# Patient Record
Sex: Female | Born: 1950 | Race: White | Hispanic: No | Marital: Married | Smoking: Never smoker
Health system: Southern US, Community
[De-identification: ages and names within clinical notes are randomized; demographics above are authoritative.]

## PROBLEM LIST (undated history)

## (undated) DIAGNOSIS — M1990 Unspecified osteoarthritis, unspecified site: Secondary | ICD-10-CM

## (undated) DIAGNOSIS — I10 Essential (primary) hypertension: Secondary | ICD-10-CM

## (undated) HISTORY — PX: OTHER SURGICAL HISTORY: SHX169

## (undated) HISTORY — DX: Unspecified osteoarthritis, unspecified site: M19.90

## (undated) HISTORY — DX: Essential (primary) hypertension: I10

## (undated) HISTORY — PX: DILATION AND CURETTAGE OF UTERUS: SHX78

---
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
## 1999-02-11 ENCOUNTER — Ambulatory Visit (HOSPITAL_COMMUNITY): Admission: RE | Admit: 1999-02-11 | Discharge: 1999-02-11 | Payer: Self-pay | Admitting: Family Medicine

## 1999-02-11 ENCOUNTER — Encounter: Payer: Self-pay | Admitting: Family Medicine

## 1999-12-09 ENCOUNTER — Encounter: Payer: Self-pay | Admitting: Obstetrics and Gynecology

## 1999-12-09 ENCOUNTER — Encounter: Admission: RE | Admit: 1999-12-09 | Discharge: 1999-12-09 | Payer: Self-pay | Admitting: Obstetrics and Gynecology

## 2001-01-23 ENCOUNTER — Encounter: Admission: RE | Admit: 2001-01-23 | Discharge: 2001-01-23 | Payer: Self-pay | Admitting: Obstetrics and Gynecology

## 2001-01-23 ENCOUNTER — Encounter: Payer: Self-pay | Admitting: Obstetrics and Gynecology

## 2001-01-30 ENCOUNTER — Encounter: Admission: RE | Admit: 2001-01-30 | Discharge: 2001-01-30 | Payer: Self-pay | Admitting: Obstetrics and Gynecology

## 2001-01-30 ENCOUNTER — Encounter: Payer: Self-pay | Admitting: Obstetrics and Gynecology

## 2002-01-02 ENCOUNTER — Encounter (INDEPENDENT_AMBULATORY_CARE_PROVIDER_SITE_OTHER): Payer: Self-pay | Admitting: *Deleted

## 2002-01-02 ENCOUNTER — Encounter: Payer: Self-pay | Admitting: Obstetrics and Gynecology

## 2002-01-02 ENCOUNTER — Encounter: Admission: RE | Admit: 2002-01-02 | Discharge: 2002-01-02 | Payer: Self-pay | Admitting: Obstetrics and Gynecology

## 2002-01-31 ENCOUNTER — Encounter: Admission: RE | Admit: 2002-01-31 | Discharge: 2002-01-31 | Payer: Self-pay | Admitting: Obstetrics and Gynecology

## 2002-01-31 ENCOUNTER — Encounter: Payer: Self-pay | Admitting: Obstetrics and Gynecology

## 2002-02-12 ENCOUNTER — Encounter: Payer: Self-pay | Admitting: Obstetrics and Gynecology

## 2002-02-12 ENCOUNTER — Ambulatory Visit (HOSPITAL_COMMUNITY): Admission: RE | Admit: 2002-02-12 | Discharge: 2002-02-12 | Payer: Self-pay | Admitting: Obstetrics and Gynecology

## 2002-04-17 ENCOUNTER — Encounter: Payer: Self-pay | Admitting: Obstetrics and Gynecology

## 2002-04-17 ENCOUNTER — Encounter: Admission: RE | Admit: 2002-04-17 | Discharge: 2002-04-17 | Payer: Self-pay | Admitting: Obstetrics and Gynecology

## 2002-08-18 ENCOUNTER — Encounter: Admission: RE | Admit: 2002-08-18 | Discharge: 2002-08-18 | Payer: Self-pay | Admitting: Obstetrics and Gynecology

## 2002-08-18 ENCOUNTER — Encounter: Payer: Self-pay | Admitting: Obstetrics and Gynecology

## 2003-02-12 ENCOUNTER — Encounter: Admission: RE | Admit: 2003-02-12 | Discharge: 2003-02-12 | Payer: Self-pay | Admitting: Obstetrics and Gynecology

## 2003-03-16 ENCOUNTER — Encounter: Admission: RE | Admit: 2003-03-16 | Discharge: 2003-03-16 | Payer: Self-pay | Admitting: Obstetrics and Gynecology

## 2003-09-07 ENCOUNTER — Ambulatory Visit (HOSPITAL_COMMUNITY): Admission: RE | Admit: 2003-09-07 | Discharge: 2003-09-07 | Payer: Self-pay | Admitting: Family Medicine

## 2004-04-07 ENCOUNTER — Encounter: Admission: RE | Admit: 2004-04-07 | Discharge: 2004-04-07 | Payer: Self-pay | Admitting: Obstetrics and Gynecology

## 2004-11-03 ENCOUNTER — Encounter: Admission: RE | Admit: 2004-11-03 | Discharge: 2004-11-03 | Payer: Self-pay | Admitting: Obstetrics and Gynecology

## 2005-01-11 ENCOUNTER — Ambulatory Visit: Payer: Self-pay | Admitting: Internal Medicine

## 2005-01-31 ENCOUNTER — Encounter (INDEPENDENT_AMBULATORY_CARE_PROVIDER_SITE_OTHER): Payer: Self-pay | Admitting: Specialist

## 2005-01-31 ENCOUNTER — Ambulatory Visit: Payer: Self-pay | Admitting: Internal Medicine

## 2005-02-10 ENCOUNTER — Encounter: Admission: RE | Admit: 2005-02-10 | Discharge: 2005-02-10 | Payer: Self-pay | Admitting: Obstetrics and Gynecology

## 2005-06-21 ENCOUNTER — Encounter: Admission: RE | Admit: 2005-06-21 | Discharge: 2005-06-21 | Payer: Self-pay | Admitting: Obstetrics and Gynecology

## 2005-08-14 ENCOUNTER — Encounter: Admission: RE | Admit: 2005-08-14 | Discharge: 2005-08-14 | Payer: Self-pay | Admitting: Obstetrics and Gynecology

## 2005-08-22 ENCOUNTER — Encounter: Admission: RE | Admit: 2005-08-22 | Discharge: 2005-08-22 | Payer: Self-pay | Admitting: Obstetrics and Gynecology

## 2006-10-17 ENCOUNTER — Encounter: Admission: RE | Admit: 2006-10-17 | Discharge: 2006-10-17 | Payer: Self-pay | Admitting: Obstetrics and Gynecology

## 2007-08-26 ENCOUNTER — Encounter: Admission: RE | Admit: 2007-08-26 | Discharge: 2007-08-26 | Payer: Self-pay | Admitting: Family Medicine

## 2007-11-13 ENCOUNTER — Encounter: Admission: RE | Admit: 2007-11-13 | Discharge: 2007-11-13 | Payer: Self-pay | Admitting: Family Medicine

## 2007-11-13 ENCOUNTER — Encounter: Admission: RE | Admit: 2007-11-13 | Discharge: 2007-11-13 | Payer: Self-pay | Admitting: Obstetrics and Gynecology

## 2007-11-28 ENCOUNTER — Encounter: Admission: RE | Admit: 2007-11-28 | Discharge: 2007-11-28 | Payer: Self-pay | Admitting: Obstetrics and Gynecology

## 2009-04-08 ENCOUNTER — Encounter: Admission: RE | Admit: 2009-04-08 | Discharge: 2009-04-08 | Payer: Self-pay | Admitting: Obstetrics and Gynecology

## 2009-12-23 ENCOUNTER — Encounter: Payer: Self-pay | Admitting: Internal Medicine

## 2010-04-03 ENCOUNTER — Encounter: Payer: Self-pay | Admitting: Obstetrics and Gynecology

## 2010-04-14 NOTE — Letter (Signed)
Summary: Colonoscopy Letter  [facility]  [address] [place], [place] [address]   Phone: [phone]  Fax: [phone]      December 23, 2009 MRN: [id number]   [facility] [address] [place], [place]  [address]   Dear [name],   According to your medical record, it is time for you to schedule a Colonoscopy. The American Cancer Society recommends this procedure as a method to detect early colon cancer. Patients with a family history of colon cancer, or a personal history of colon polyps or inflammatory bowel disease are at increased risk.  This letter has been generated based on the recommendations made at the time of your procedure. If you feel that in your particular situation this may no longer apply, please contact our office.  Please call our office at [phone] to schedule this appointment or to update your records at your earliest convenience.  Thank you for cooperating with [place] to provide you with the very best care possible.   Sincerely,  Wilhemina Bonito. Marina Goodell, M.D.  [facility] [phone]

## 2010-05-03 ENCOUNTER — Other Ambulatory Visit: Payer: Self-pay | Admitting: Family Medicine

## 2010-05-03 DIAGNOSIS — Z1231 Encounter for screening mammogram for malignant neoplasm of breast: Secondary | ICD-10-CM

## 2010-05-11 ENCOUNTER — Ambulatory Visit: Payer: Self-pay

## 2010-05-18 ENCOUNTER — Ambulatory Visit
Admission: RE | Admit: 2010-05-18 | Discharge: 2010-05-18 | Disposition: A | Discharge status: Home / Self Care | Payer: BC Managed Care – PPO | Source: Ambulatory Visit | Attending: Family Medicine | Admitting: Family Medicine

## 2010-05-18 DIAGNOSIS — Z1231 Encounter for screening mammogram for malignant neoplasm of breast: Secondary | ICD-10-CM

## 2011-10-13 ENCOUNTER — Other Ambulatory Visit: Payer: Self-pay | Admitting: Family Medicine

## 2011-10-13 DIAGNOSIS — Z1231 Encounter for screening mammogram for malignant neoplasm of breast: Secondary | ICD-10-CM

## 2011-10-25 ENCOUNTER — Ambulatory Visit
Admission: RE | Admit: 2011-10-25 | Discharge: 2011-10-25 | Disposition: A | Discharge status: Home / Self Care | Payer: BC Managed Care – PPO | Source: Ambulatory Visit | Attending: Family Medicine | Admitting: Family Medicine

## 2011-10-25 DIAGNOSIS — Z1231 Encounter for screening mammogram for malignant neoplasm of breast: Secondary | ICD-10-CM

## 2011-12-01 ENCOUNTER — Encounter: Payer: Self-pay | Admitting: Internal Medicine

## 2011-12-04 ENCOUNTER — Encounter: Payer: Self-pay | Admitting: Internal Medicine

## 2012-06-24 ENCOUNTER — Other Ambulatory Visit: Payer: Self-pay | Admitting: Family Medicine

## 2012-06-24 DIAGNOSIS — Z780 Asymptomatic menopausal state: Secondary | ICD-10-CM

## 2012-07-09 ENCOUNTER — Ambulatory Visit
Admission: RE | Admit: 2012-07-09 | Discharge: 2012-07-09 | Disposition: A | Discharge status: Home / Self Care | Payer: BC Managed Care – PPO | Source: Ambulatory Visit | Attending: Family Medicine | Admitting: Family Medicine

## 2012-07-09 DIAGNOSIS — Z780 Asymptomatic menopausal state: Secondary | ICD-10-CM

## 2012-07-15 ENCOUNTER — Other Ambulatory Visit: Payer: Self-pay | Admitting: *Deleted

## 2012-07-15 DIAGNOSIS — Z8742 Personal history of other diseases of the female genital tract: Secondary | ICD-10-CM

## 2012-07-16 ENCOUNTER — Ambulatory Visit
Admission: RE | Admit: 2012-07-16 | Discharge: 2012-07-16 | Disposition: A | Discharge status: Home / Self Care | Payer: BC Managed Care – PPO | Source: Ambulatory Visit | Attending: *Deleted | Admitting: *Deleted

## 2012-07-16 DIAGNOSIS — Z8742 Personal history of other diseases of the female genital tract: Secondary | ICD-10-CM

## 2013-06-05 ENCOUNTER — Other Ambulatory Visit: Payer: Self-pay | Admitting: Family Medicine

## 2013-06-05 DIAGNOSIS — N644 Mastodynia: Secondary | ICD-10-CM

## 2013-06-17 ENCOUNTER — Ambulatory Visit
Admission: RE | Admit: 2013-06-17 | Discharge: 2013-06-17 | Disposition: A | Discharge status: Home / Self Care | Payer: Self-pay | Source: Ambulatory Visit | Attending: Family Medicine | Admitting: Family Medicine

## 2013-06-17 ENCOUNTER — Ambulatory Visit
Admission: RE | Admit: 2013-06-17 | Discharge: 2013-06-17 | Disposition: A | Discharge status: Home / Self Care | Payer: BC Managed Care – PPO | Source: Ambulatory Visit | Attending: Family Medicine | Admitting: Family Medicine

## 2013-06-17 DIAGNOSIS — N644 Mastodynia: Secondary | ICD-10-CM

## 2014-09-09 ENCOUNTER — Other Ambulatory Visit: Payer: Self-pay

## 2014-09-09 DIAGNOSIS — Z1231 Encounter for screening mammogram for malignant neoplasm of breast: Secondary | ICD-10-CM

## 2014-09-22 ENCOUNTER — Ambulatory Visit
Admission: RE | Admit: 2014-09-22 | Discharge: 2014-09-22 | Disposition: A | Discharge status: Home / Self Care | Payer: BC Managed Care – PPO | Source: Ambulatory Visit

## 2014-09-22 DIAGNOSIS — Z1231 Encounter for screening mammogram for malignant neoplasm of breast: Secondary | ICD-10-CM

## 2015-12-02 ENCOUNTER — Other Ambulatory Visit: Payer: Self-pay | Admitting: Family Medicine

## 2015-12-02 DIAGNOSIS — Z1231 Encounter for screening mammogram for malignant neoplasm of breast: Secondary | ICD-10-CM

## 2015-12-08 ENCOUNTER — Ambulatory Visit
Admission: RE | Admit: 2015-12-08 | Discharge: 2015-12-08 | Disposition: A | Discharge status: Home / Self Care | Payer: BC Managed Care – PPO | Source: Ambulatory Visit | Attending: Family Medicine | Admitting: Family Medicine

## 2015-12-08 DIAGNOSIS — Z1231 Encounter for screening mammogram for malignant neoplasm of breast: Secondary | ICD-10-CM

## 2015-12-10 ENCOUNTER — Other Ambulatory Visit: Payer: Self-pay | Admitting: Family Medicine

## 2015-12-10 DIAGNOSIS — R928 Other abnormal and inconclusive findings on diagnostic imaging of breast: Secondary | ICD-10-CM

## 2015-12-15 ENCOUNTER — Ambulatory Visit
Admission: RE | Admit: 2015-12-15 | Discharge: 2015-12-15 | Disposition: A | Discharge status: Home / Self Care | Payer: BC Managed Care – PPO | Source: Ambulatory Visit | Attending: Family Medicine | Admitting: Family Medicine

## 2015-12-15 ENCOUNTER — Other Ambulatory Visit: Payer: Self-pay | Admitting: Family Medicine

## 2015-12-15 DIAGNOSIS — R928 Other abnormal and inconclusive findings on diagnostic imaging of breast: Secondary | ICD-10-CM

## 2016-10-24 ENCOUNTER — Other Ambulatory Visit: Payer: Self-pay | Admitting: Family Medicine

## 2016-10-24 ENCOUNTER — Other Ambulatory Visit (HOSPITAL_COMMUNITY)
Admission: RE | Admit: 2016-10-24 | Discharge: 2016-10-24 | Disposition: A | Discharge status: Home / Self Care | Payer: BC Managed Care – PPO | Source: Ambulatory Visit | Attending: Family Medicine | Admitting: Family Medicine

## 2016-10-24 DIAGNOSIS — Z124 Encounter for screening for malignant neoplasm of cervix: Secondary | ICD-10-CM | POA: Insufficient documentation

## 2016-10-26 LAB — CYTOLOGY - PAP: Diagnosis: NEGATIVE

## 2017-04-04 ENCOUNTER — Other Ambulatory Visit: Payer: Self-pay | Admitting: Family Medicine

## 2017-04-04 ENCOUNTER — Ambulatory Visit: Payer: BC Managed Care – PPO

## 2017-04-04 DIAGNOSIS — Z139 Encounter for screening, unspecified: Secondary | ICD-10-CM

## 2017-04-05 ENCOUNTER — Ambulatory Visit: Payer: BC Managed Care – PPO

## 2017-04-05 ENCOUNTER — Ambulatory Visit
Admission: RE | Admit: 2017-04-05 | Discharge: 2017-04-05 | Disposition: A | Discharge status: Home / Self Care | Payer: BC Managed Care – PPO | Source: Ambulatory Visit | Attending: Family Medicine | Admitting: Family Medicine

## 2017-04-05 DIAGNOSIS — Z139 Encounter for screening, unspecified: Secondary | ICD-10-CM

## 2018-04-25 ENCOUNTER — Other Ambulatory Visit: Payer: Self-pay | Admitting: Family Medicine

## 2018-04-25 DIAGNOSIS — Z1231 Encounter for screening mammogram for malignant neoplasm of breast: Secondary | ICD-10-CM

## 2018-05-22 ENCOUNTER — Ambulatory Visit: Payer: BC Managed Care – PPO

## 2018-11-07 ENCOUNTER — Ambulatory Visit
Admission: RE | Admit: 2018-11-07 | Discharge: 2018-11-07 | Disposition: A | Discharge status: Home / Self Care | Payer: BC Managed Care – PPO | Source: Ambulatory Visit | Attending: Family Medicine | Admitting: Family Medicine

## 2018-11-07 ENCOUNTER — Other Ambulatory Visit: Payer: Self-pay

## 2018-11-07 DIAGNOSIS — Z1231 Encounter for screening mammogram for malignant neoplasm of breast: Secondary | ICD-10-CM

## 2019-12-15 ENCOUNTER — Other Ambulatory Visit: Payer: Self-pay | Admitting: Family Medicine

## 2019-12-15 DIAGNOSIS — Z1231 Encounter for screening mammogram for malignant neoplasm of breast: Secondary | ICD-10-CM

## 2020-01-06 ENCOUNTER — Ambulatory Visit
Admission: RE | Admit: 2020-01-06 | Discharge: 2020-01-06 | Disposition: A | Discharge status: Home / Self Care | Payer: Medicare PPO | Source: Ambulatory Visit | Attending: Family Medicine | Admitting: Family Medicine

## 2020-01-06 ENCOUNTER — Other Ambulatory Visit: Payer: Self-pay

## 2020-01-06 DIAGNOSIS — Z1231 Encounter for screening mammogram for malignant neoplasm of breast: Secondary | ICD-10-CM | POA: Diagnosis not present

## 2020-01-12 DIAGNOSIS — Z23 Encounter for immunization: Secondary | ICD-10-CM | POA: Diagnosis not present

## 2020-05-20 DIAGNOSIS — Z23 Encounter for immunization: Secondary | ICD-10-CM | POA: Diagnosis not present

## 2020-05-20 DIAGNOSIS — I10 Essential (primary) hypertension: Secondary | ICD-10-CM | POA: Diagnosis not present

## 2020-05-20 DIAGNOSIS — E7800 Pure hypercholesterolemia, unspecified: Secondary | ICD-10-CM | POA: Diagnosis not present

## 2020-12-17 DIAGNOSIS — I10 Essential (primary) hypertension: Secondary | ICD-10-CM | POA: Diagnosis not present

## 2020-12-21 DIAGNOSIS — Z1389 Encounter for screening for other disorder: Secondary | ICD-10-CM | POA: Diagnosis not present

## 2020-12-21 DIAGNOSIS — Z23 Encounter for immunization: Secondary | ICD-10-CM | POA: Diagnosis not present

## 2020-12-21 DIAGNOSIS — B009 Herpesviral infection, unspecified: Secondary | ICD-10-CM | POA: Diagnosis not present

## 2020-12-21 DIAGNOSIS — F419 Anxiety disorder, unspecified: Secondary | ICD-10-CM | POA: Diagnosis not present

## 2020-12-21 DIAGNOSIS — I10 Essential (primary) hypertension: Secondary | ICD-10-CM | POA: Diagnosis not present

## 2020-12-21 DIAGNOSIS — M25552 Pain in left hip: Secondary | ICD-10-CM | POA: Diagnosis not present

## 2020-12-21 DIAGNOSIS — Z0000 Encounter for general adult medical examination without abnormal findings: Secondary | ICD-10-CM | POA: Diagnosis not present

## 2020-12-21 DIAGNOSIS — L989 Disorder of the skin and subcutaneous tissue, unspecified: Secondary | ICD-10-CM | POA: Diagnosis not present

## 2020-12-21 DIAGNOSIS — E7800 Pure hypercholesterolemia, unspecified: Secondary | ICD-10-CM | POA: Diagnosis not present

## 2021-06-06 ENCOUNTER — Other Ambulatory Visit: Payer: Self-pay | Admitting: Family Medicine

## 2021-06-06 DIAGNOSIS — Z1231 Encounter for screening mammogram for malignant neoplasm of breast: Secondary | ICD-10-CM

## 2021-06-08 ENCOUNTER — Ambulatory Visit
Admission: RE | Admit: 2021-06-08 | Discharge: 2021-06-08 | Disposition: A | Discharge status: Home / Self Care | Payer: Medicare PPO | Source: Ambulatory Visit | Attending: Family Medicine | Admitting: Family Medicine

## 2021-06-08 DIAGNOSIS — Z1231 Encounter for screening mammogram for malignant neoplasm of breast: Secondary | ICD-10-CM

## 2021-06-21 DIAGNOSIS — E7800 Pure hypercholesterolemia, unspecified: Secondary | ICD-10-CM | POA: Diagnosis not present

## 2021-06-21 DIAGNOSIS — I10 Essential (primary) hypertension: Secondary | ICD-10-CM | POA: Diagnosis not present

## 2021-06-21 DIAGNOSIS — B009 Herpesviral infection, unspecified: Secondary | ICD-10-CM | POA: Diagnosis not present

## 2021-06-21 DIAGNOSIS — M2550 Pain in unspecified joint: Secondary | ICD-10-CM | POA: Diagnosis not present

## 2021-06-21 DIAGNOSIS — F419 Anxiety disorder, unspecified: Secondary | ICD-10-CM | POA: Diagnosis not present

## 2021-12-20 DIAGNOSIS — Z136 Encounter for screening for cardiovascular disorders: Secondary | ICD-10-CM | POA: Diagnosis not present

## 2021-12-20 DIAGNOSIS — Z0000 Encounter for general adult medical examination without abnormal findings: Secondary | ICD-10-CM | POA: Diagnosis not present

## 2022-02-07 DIAGNOSIS — I10 Essential (primary) hypertension: Secondary | ICD-10-CM | POA: Diagnosis not present

## 2022-02-07 DIAGNOSIS — Z0000 Encounter for general adult medical examination without abnormal findings: Secondary | ICD-10-CM | POA: Diagnosis not present

## 2022-02-07 DIAGNOSIS — F419 Anxiety disorder, unspecified: Secondary | ICD-10-CM | POA: Diagnosis not present

## 2022-02-07 DIAGNOSIS — Z1331 Encounter for screening for depression: Secondary | ICD-10-CM | POA: Diagnosis not present

## 2022-02-07 DIAGNOSIS — E7800 Pure hypercholesterolemia, unspecified: Secondary | ICD-10-CM | POA: Diagnosis not present

## 2022-07-13 DIAGNOSIS — I10 Essential (primary) hypertension: Secondary | ICD-10-CM | POA: Diagnosis not present

## 2022-07-13 DIAGNOSIS — R197 Diarrhea, unspecified: Secondary | ICD-10-CM | POA: Diagnosis not present

## 2022-08-10 DIAGNOSIS — B009 Herpesviral infection, unspecified: Secondary | ICD-10-CM | POA: Diagnosis not present

## 2022-08-10 DIAGNOSIS — I10 Essential (primary) hypertension: Secondary | ICD-10-CM | POA: Diagnosis not present

## 2022-08-10 DIAGNOSIS — E7800 Pure hypercholesterolemia, unspecified: Secondary | ICD-10-CM | POA: Diagnosis not present

## 2022-12-26 DIAGNOSIS — Z23 Encounter for immunization: Secondary | ICD-10-CM | POA: Diagnosis not present

## 2022-12-26 DIAGNOSIS — Z2989 Encounter for other specified prophylactic measures: Secondary | ICD-10-CM | POA: Diagnosis not present

## 2023-02-07 DIAGNOSIS — I10 Essential (primary) hypertension: Secondary | ICD-10-CM | POA: Diagnosis not present

## 2023-02-07 DIAGNOSIS — E7800 Pure hypercholesterolemia, unspecified: Secondary | ICD-10-CM | POA: Diagnosis not present

## 2023-02-15 DIAGNOSIS — Z1331 Encounter for screening for depression: Secondary | ICD-10-CM | POA: Diagnosis not present

## 2023-02-15 DIAGNOSIS — E7800 Pure hypercholesterolemia, unspecified: Secondary | ICD-10-CM | POA: Diagnosis not present

## 2023-02-15 DIAGNOSIS — M8580 Other specified disorders of bone density and structure, unspecified site: Secondary | ICD-10-CM | POA: Diagnosis not present

## 2023-02-15 DIAGNOSIS — Z0000 Encounter for general adult medical examination without abnormal findings: Secondary | ICD-10-CM | POA: Diagnosis not present

## 2023-02-15 DIAGNOSIS — I10 Essential (primary) hypertension: Secondary | ICD-10-CM | POA: Diagnosis not present

## 2023-02-15 DIAGNOSIS — B009 Herpesviral infection, unspecified: Secondary | ICD-10-CM | POA: Diagnosis not present

## 2023-02-15 DIAGNOSIS — M25561 Pain in right knee: Secondary | ICD-10-CM | POA: Diagnosis not present

## 2023-02-15 DIAGNOSIS — H35033 Hypertensive retinopathy, bilateral: Secondary | ICD-10-CM | POA: Diagnosis not present

## 2023-02-15 DIAGNOSIS — F419 Anxiety disorder, unspecified: Secondary | ICD-10-CM | POA: Diagnosis not present

## 2023-03-22 DIAGNOSIS — M25461 Effusion, right knee: Secondary | ICD-10-CM | POA: Diagnosis not present

## 2023-03-22 DIAGNOSIS — M25552 Pain in left hip: Secondary | ICD-10-CM | POA: Diagnosis not present

## 2023-03-22 DIAGNOSIS — M160 Bilateral primary osteoarthritis of hip: Secondary | ICD-10-CM | POA: Diagnosis not present

## 2023-03-22 DIAGNOSIS — M25561 Pain in right knee: Secondary | ICD-10-CM | POA: Diagnosis not present

## 2023-03-22 DIAGNOSIS — M1711 Unilateral primary osteoarthritis, right knee: Secondary | ICD-10-CM | POA: Diagnosis not present

## 2023-05-15 ENCOUNTER — Other Ambulatory Visit: Payer: Self-pay | Admitting: Family Medicine

## 2023-05-15 DIAGNOSIS — Z1231 Encounter for screening mammogram for malignant neoplasm of breast: Secondary | ICD-10-CM

## 2023-05-16 DIAGNOSIS — M25552 Pain in left hip: Secondary | ICD-10-CM | POA: Diagnosis not present

## 2023-05-16 DIAGNOSIS — M25561 Pain in right knee: Secondary | ICD-10-CM | POA: Diagnosis not present

## 2023-05-17 ENCOUNTER — Ambulatory Visit
Admission: RE | Admit: 2023-05-17 | Discharge: 2023-05-17 | Disposition: A | Discharge status: Home / Self Care | Source: Ambulatory Visit | Attending: Family Medicine | Admitting: Family Medicine

## 2023-05-17 DIAGNOSIS — Z1231 Encounter for screening mammogram for malignant neoplasm of breast: Secondary | ICD-10-CM | POA: Diagnosis not present

## 2023-05-21 DIAGNOSIS — M25561 Pain in right knee: Secondary | ICD-10-CM | POA: Diagnosis not present

## 2023-05-21 DIAGNOSIS — M25552 Pain in left hip: Secondary | ICD-10-CM | POA: Diagnosis not present

## 2023-05-24 DIAGNOSIS — M25552 Pain in left hip: Secondary | ICD-10-CM | POA: Diagnosis not present

## 2023-05-24 DIAGNOSIS — M25561 Pain in right knee: Secondary | ICD-10-CM | POA: Diagnosis not present

## 2023-05-28 DIAGNOSIS — Z20818 Contact with and (suspected) exposure to other bacterial communicable diseases: Secondary | ICD-10-CM | POA: Diagnosis not present

## 2023-05-28 DIAGNOSIS — M25552 Pain in left hip: Secondary | ICD-10-CM | POA: Diagnosis not present

## 2023-05-28 DIAGNOSIS — L989 Disorder of the skin and subcutaneous tissue, unspecified: Secondary | ICD-10-CM | POA: Diagnosis not present

## 2023-05-28 DIAGNOSIS — M25561 Pain in right knee: Secondary | ICD-10-CM | POA: Diagnosis not present

## 2023-05-31 DIAGNOSIS — M25561 Pain in right knee: Secondary | ICD-10-CM | POA: Diagnosis not present

## 2023-05-31 DIAGNOSIS — M25552 Pain in left hip: Secondary | ICD-10-CM | POA: Diagnosis not present

## 2023-06-04 DIAGNOSIS — M25552 Pain in left hip: Secondary | ICD-10-CM | POA: Diagnosis not present

## 2023-06-04 DIAGNOSIS — M25561 Pain in right knee: Secondary | ICD-10-CM | POA: Diagnosis not present

## 2023-06-07 DIAGNOSIS — M25552 Pain in left hip: Secondary | ICD-10-CM | POA: Diagnosis not present

## 2023-06-07 DIAGNOSIS — M25561 Pain in right knee: Secondary | ICD-10-CM | POA: Diagnosis not present

## 2023-06-11 DIAGNOSIS — M25561 Pain in right knee: Secondary | ICD-10-CM | POA: Diagnosis not present

## 2023-06-11 DIAGNOSIS — M25552 Pain in left hip: Secondary | ICD-10-CM | POA: Diagnosis not present

## 2023-06-14 DIAGNOSIS — M25561 Pain in right knee: Secondary | ICD-10-CM | POA: Diagnosis not present

## 2023-06-14 DIAGNOSIS — M25552 Pain in left hip: Secondary | ICD-10-CM | POA: Diagnosis not present

## 2023-06-18 DIAGNOSIS — M25552 Pain in left hip: Secondary | ICD-10-CM | POA: Diagnosis not present

## 2023-06-18 DIAGNOSIS — M25561 Pain in right knee: Secondary | ICD-10-CM | POA: Diagnosis not present

## 2023-06-25 DIAGNOSIS — M25561 Pain in right knee: Secondary | ICD-10-CM | POA: Diagnosis not present

## 2023-06-25 DIAGNOSIS — M25552 Pain in left hip: Secondary | ICD-10-CM | POA: Diagnosis not present

## 2023-06-28 DIAGNOSIS — M25561 Pain in right knee: Secondary | ICD-10-CM | POA: Diagnosis not present

## 2023-06-28 DIAGNOSIS — M25552 Pain in left hip: Secondary | ICD-10-CM | POA: Diagnosis not present

## 2023-06-30 IMAGING — MG MM DIGITAL SCREENING BILAT W/ TOMO AND CAD
8 series · 9 of 24 positions shown · non-contrast
Comparison: Previous exam(s).

CLINICAL DATA: Screening.

EXAM:
DIGITAL SCREENING BILATERAL MAMMOGRAM WITH TOMOSYNTHESIS AND CAD
TECHNIQUE: Bilateral screening digital craniocaudal and mediolateral oblique
mammograms were obtained. Bilateral screening digital breast
tomosynthesis was performed. The images were evaluated with
computer-aided detection.
Please note that due to patient's RIGHT shoulder and arm issues,
there is slightly limited positioning/decreased coverage of the
RIGHT breast.

[R CC synth-2D]
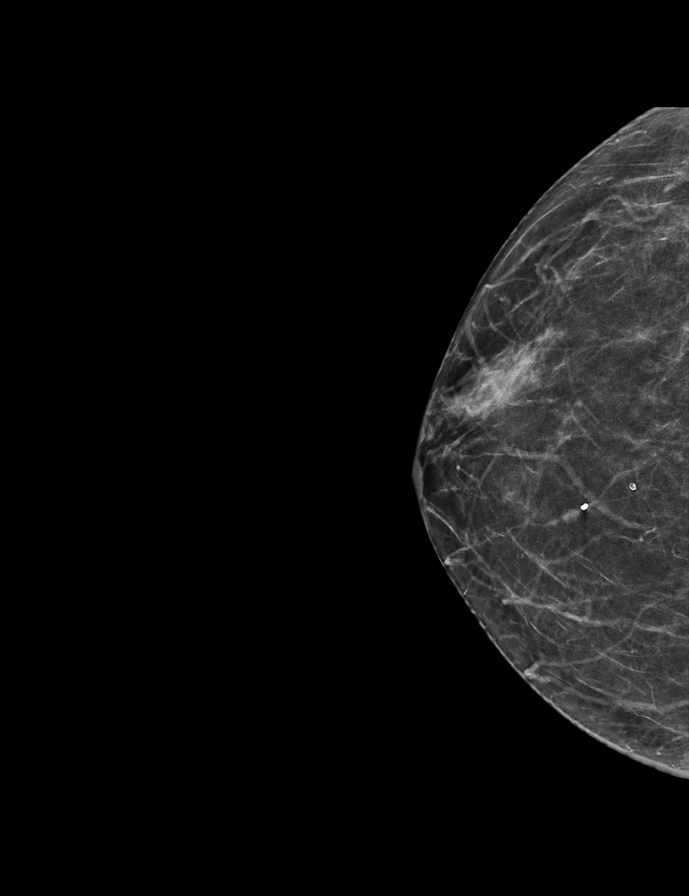

[L CC synth-2D]
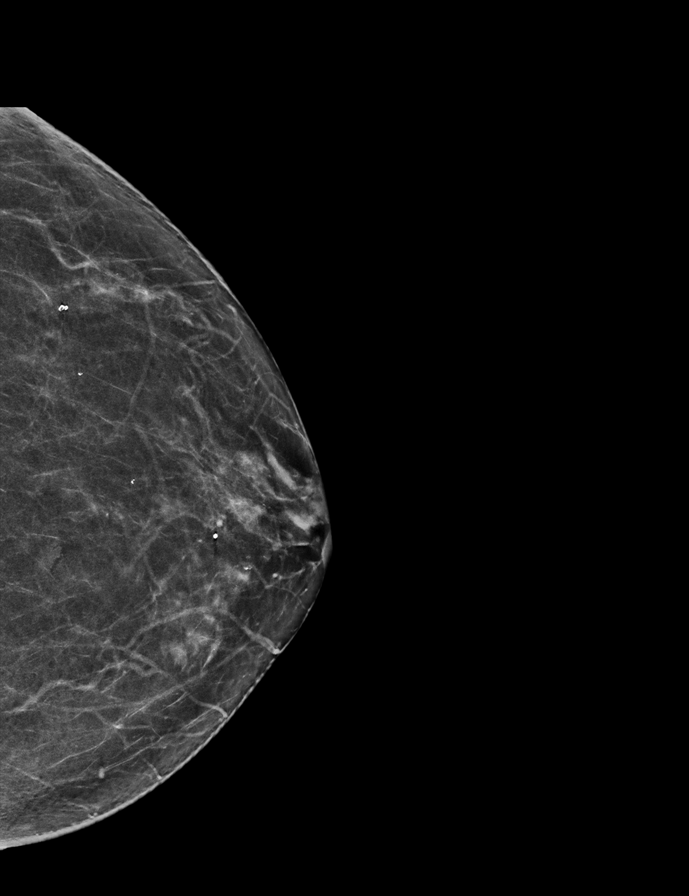

[R MLO synth-2D]
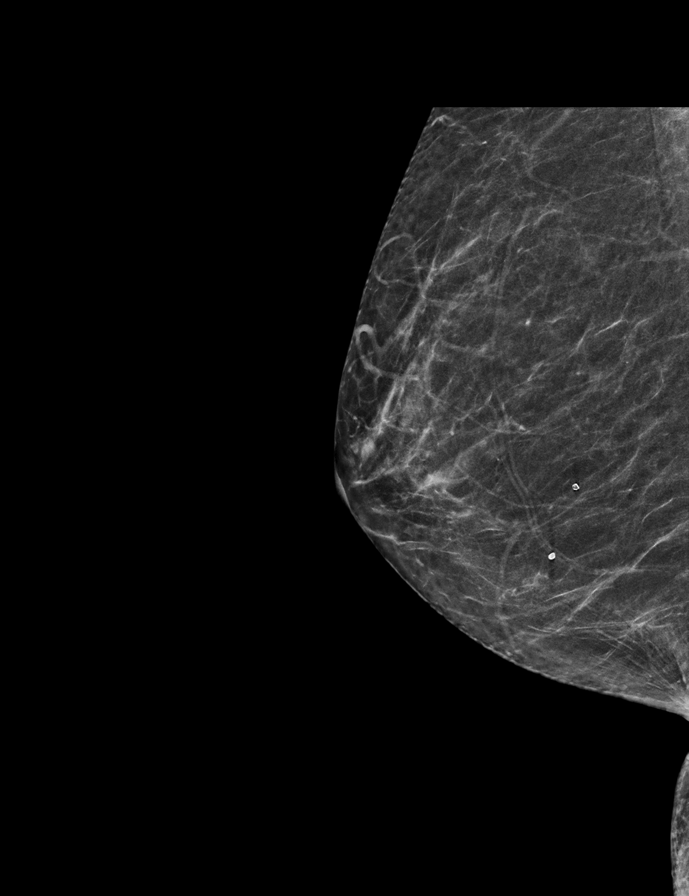

[L MLO synth-2D]
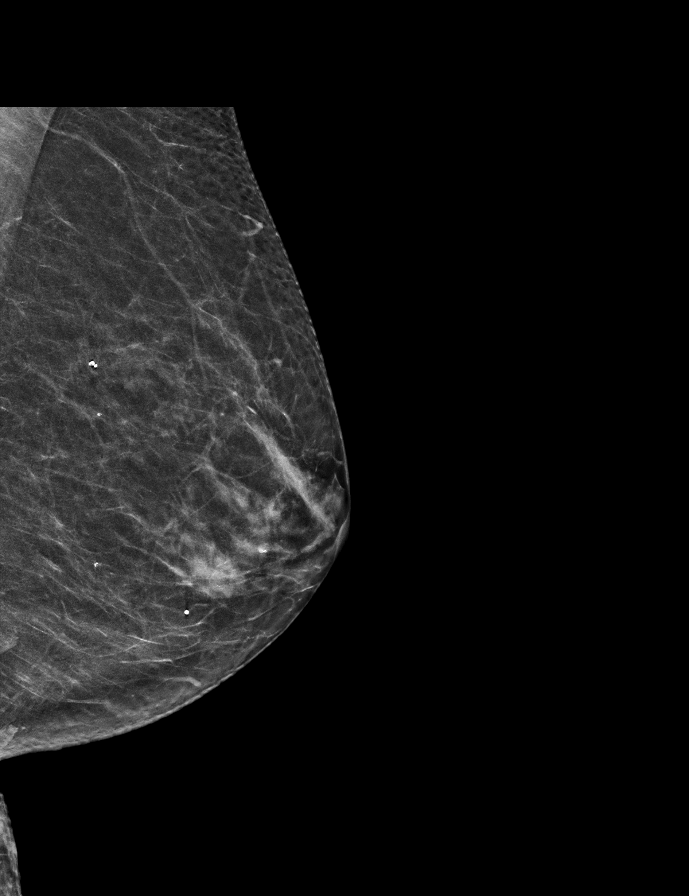

[R CC tomo · 2 of 53 frames shown]
[frame 18/53]
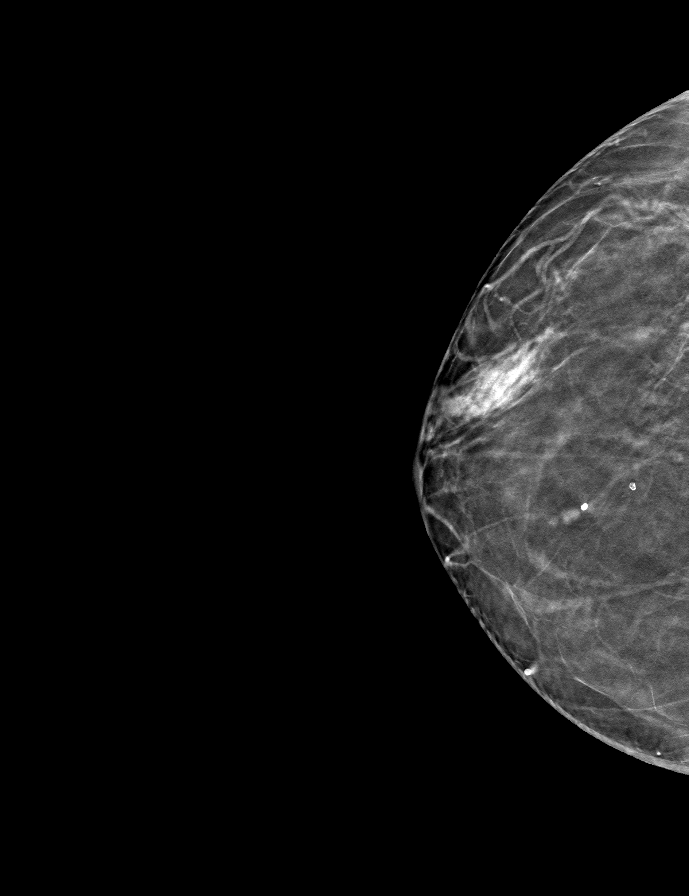
[frame 27/53]
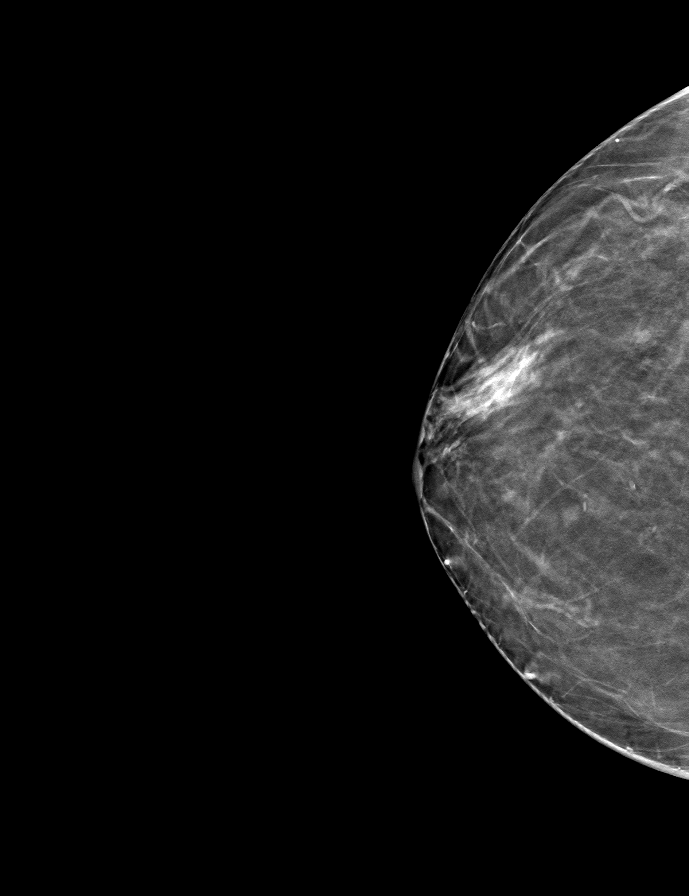

[L CC tomo · tomo slice 31/60.0]
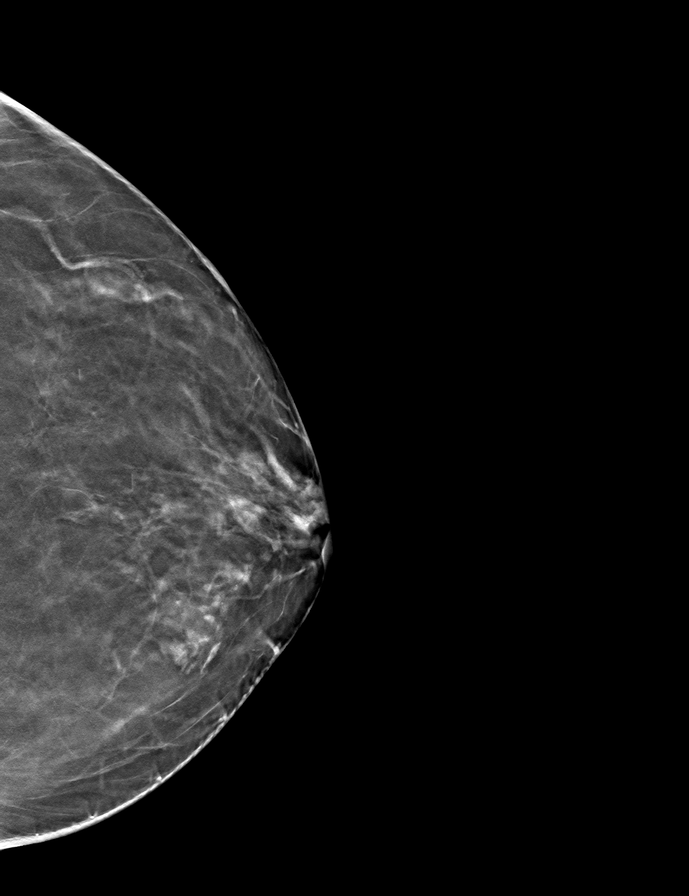

[R MLO tomo · tomo slice 29/56.0]
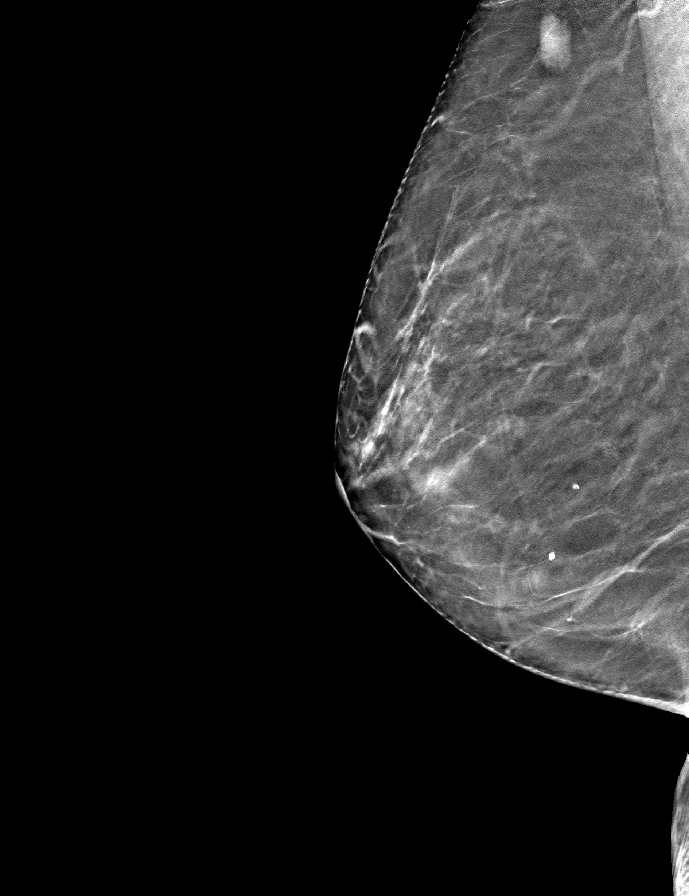

[L MLO tomo · tomo slice 29/57.0]
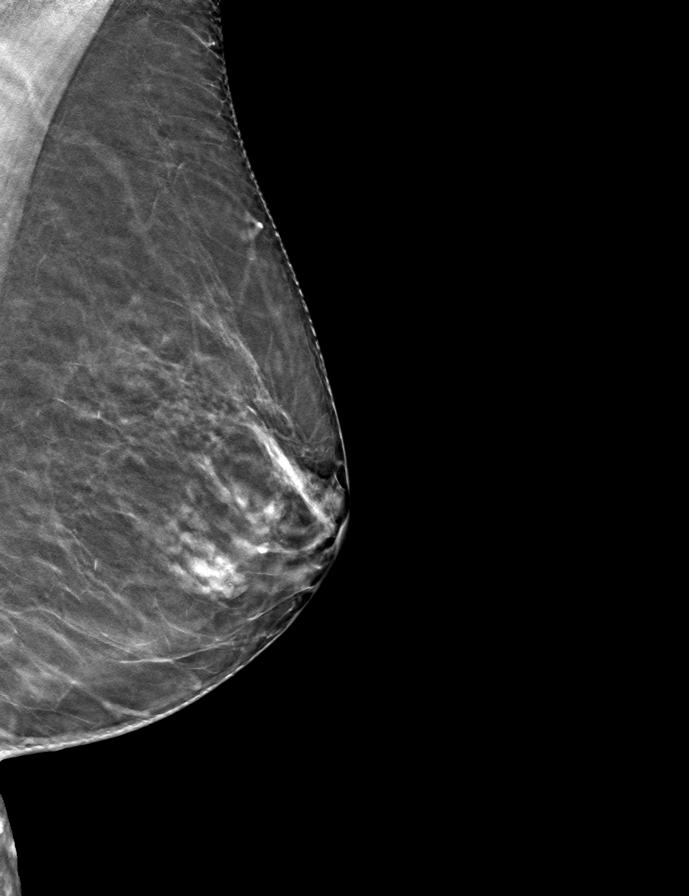

[9 of 24 positions shown; findings below may reference images not displayed]

ACR Breast Density Category b: There are scattered areas of
fibroglandular density.
FINDINGS: There are no findings suspicious for malignancy.
IMPRESSION: No mammographic evidence of malignancy. A result letter of this
screening mammogram will be mailed directly to the patient.

RECOMMENDATION:
Screening mammogram in one year. (Code:U2-F-SP7)

BI-RADS CATEGORY  1: Negative.

## 2023-07-02 DIAGNOSIS — M25552 Pain in left hip: Secondary | ICD-10-CM | POA: Diagnosis not present

## 2023-07-02 DIAGNOSIS — M25561 Pain in right knee: Secondary | ICD-10-CM | POA: Diagnosis not present

## 2023-07-05 DIAGNOSIS — M25561 Pain in right knee: Secondary | ICD-10-CM | POA: Diagnosis not present

## 2023-07-05 DIAGNOSIS — M25552 Pain in left hip: Secondary | ICD-10-CM | POA: Diagnosis not present

## 2023-07-09 DIAGNOSIS — M25552 Pain in left hip: Secondary | ICD-10-CM | POA: Diagnosis not present

## 2023-07-09 DIAGNOSIS — M25561 Pain in right knee: Secondary | ICD-10-CM | POA: Diagnosis not present

## 2023-07-27 DIAGNOSIS — M25552 Pain in left hip: Secondary | ICD-10-CM | POA: Diagnosis not present

## 2023-07-27 DIAGNOSIS — M25561 Pain in right knee: Secondary | ICD-10-CM | POA: Diagnosis not present

## 2023-08-16 DIAGNOSIS — B009 Herpesviral infection, unspecified: Secondary | ICD-10-CM | POA: Diagnosis not present

## 2023-08-16 DIAGNOSIS — E7800 Pure hypercholesterolemia, unspecified: Secondary | ICD-10-CM | POA: Diagnosis not present

## 2023-08-16 DIAGNOSIS — I10 Essential (primary) hypertension: Secondary | ICD-10-CM | POA: Diagnosis not present

## 2023-08-16 DIAGNOSIS — K469 Unspecified abdominal hernia without obstruction or gangrene: Secondary | ICD-10-CM | POA: Diagnosis not present

## 2023-08-16 DIAGNOSIS — M8580 Other specified disorders of bone density and structure, unspecified site: Secondary | ICD-10-CM | POA: Diagnosis not present

## 2023-08-16 DIAGNOSIS — F419 Anxiety disorder, unspecified: Secondary | ICD-10-CM | POA: Diagnosis not present

## 2023-08-28 DIAGNOSIS — K439 Ventral hernia without obstruction or gangrene: Secondary | ICD-10-CM | POA: Diagnosis not present

## 2023-09-24 DIAGNOSIS — K439 Ventral hernia without obstruction or gangrene: Secondary | ICD-10-CM | POA: Diagnosis not present

## 2023-09-24 DIAGNOSIS — R222 Localized swelling, mass and lump, trunk: Secondary | ICD-10-CM | POA: Diagnosis not present

## 2023-09-25 ENCOUNTER — Other Ambulatory Visit: Payer: Self-pay | Admitting: Surgery

## 2023-09-25 DIAGNOSIS — K439 Ventral hernia without obstruction or gangrene: Secondary | ICD-10-CM

## 2023-09-25 DIAGNOSIS — R222 Localized swelling, mass and lump, trunk: Secondary | ICD-10-CM

## 2023-10-03 ENCOUNTER — Ambulatory Visit
Admission: RE | Admit: 2023-10-03 | Discharge: 2023-10-03 | Disposition: A | Discharge status: Home / Self Care | Source: Ambulatory Visit | Attending: Surgery | Admitting: Surgery

## 2023-10-03 DIAGNOSIS — N838 Other noninflammatory disorders of ovary, fallopian tube and broad ligament: Secondary | ICD-10-CM | POA: Diagnosis not present

## 2023-10-03 DIAGNOSIS — M7989 Other specified soft tissue disorders: Secondary | ICD-10-CM | POA: Diagnosis not present

## 2023-10-03 DIAGNOSIS — R222 Localized swelling, mass and lump, trunk: Secondary | ICD-10-CM

## 2023-10-03 DIAGNOSIS — K439 Ventral hernia without obstruction or gangrene: Secondary | ICD-10-CM

## 2023-10-03 MED ORDER — IOPAMIDOL (ISOVUE-300) INJECTION 61%
100.0000 mL | Freq: Once | INTRAVENOUS | Status: AC | PRN
  Administered 2023-10-03: 100 mL via INTRAVENOUS

## 2023-10-05 ENCOUNTER — Encounter: Payer: Self-pay | Admitting: Surgery

## 2023-10-05 ENCOUNTER — Other Ambulatory Visit: Payer: Self-pay | Admitting: Surgery

## 2023-10-05 DIAGNOSIS — N838 Other noninflammatory disorders of ovary, fallopian tube and broad ligament: Secondary | ICD-10-CM

## 2023-10-08 ENCOUNTER — Ambulatory Visit
Admission: RE | Admit: 2023-10-08 | Discharge: 2023-10-08 | Disposition: A | Discharge status: Home / Self Care | Source: Ambulatory Visit | Attending: Surgery | Admitting: Surgery

## 2023-10-08 DIAGNOSIS — N838 Other noninflammatory disorders of ovary, fallopian tube and broad ligament: Secondary | ICD-10-CM | POA: Diagnosis not present

## 2023-10-08 DIAGNOSIS — R933 Abnormal findings on diagnostic imaging of other parts of digestive tract: Secondary | ICD-10-CM | POA: Diagnosis not present

## 2023-10-16 ENCOUNTER — Telehealth: Payer: Self-pay

## 2023-10-16 DIAGNOSIS — K449 Diaphragmatic hernia without obstruction or gangrene: Secondary | ICD-10-CM | POA: Diagnosis not present

## 2023-10-16 DIAGNOSIS — R933 Abnormal findings on diagnostic imaging of other parts of digestive tract: Secondary | ICD-10-CM | POA: Diagnosis not present

## 2023-10-16 NOTE — Telephone Encounter (Signed)
 Spoke with the patient regarding the referral to GYN oncology. Patient scheduled as new patient with Dr Eldonna on 10/22/2023. Patient given an arrival time of 10:00am.  Explained to the patient the the doctor will perform a pelvic exam at this visit. Patient given the policy that only one visitor allowed and that visitor must be over 16 yrs are allowed in the Cancer Center. Patient given the address/phone number for the clinic and that the center offers free valet service. Patient aware that masks required.

## 2023-10-18 ENCOUNTER — Encounter: Payer: Self-pay | Admitting: Psychiatry

## 2023-10-22 ENCOUNTER — Encounter: Payer: Self-pay | Admitting: Psychiatry

## 2023-10-22 ENCOUNTER — Inpatient Hospital Stay

## 2023-10-22 ENCOUNTER — Inpatient Hospital Stay: Attending: Psychiatry | Admitting: Psychiatry

## 2023-10-22 VITALS — BP 127/61 | HR 70 | Temp 98.4°F | Resp 18 | Ht 67.0 in | Wt 144.6 lb

## 2023-10-22 DIAGNOSIS — N83201 Unspecified ovarian cyst, right side: Secondary | ICD-10-CM | POA: Insufficient documentation

## 2023-10-22 DIAGNOSIS — M1990 Unspecified osteoarthritis, unspecified site: Secondary | ICD-10-CM | POA: Insufficient documentation

## 2023-10-22 DIAGNOSIS — Z8041 Family history of malignant neoplasm of ovary: Secondary | ICD-10-CM | POA: Diagnosis not present

## 2023-10-22 DIAGNOSIS — Z8049 Family history of malignant neoplasm of other genital organs: Secondary | ICD-10-CM | POA: Diagnosis not present

## 2023-10-22 DIAGNOSIS — Z79899 Other long term (current) drug therapy: Secondary | ICD-10-CM | POA: Diagnosis not present

## 2023-10-22 DIAGNOSIS — Z90710 Acquired absence of both cervix and uterus: Secondary | ICD-10-CM | POA: Insufficient documentation

## 2023-10-22 DIAGNOSIS — D3911 Neoplasm of uncertain behavior of right ovary: Secondary | ICD-10-CM | POA: Insufficient documentation

## 2023-10-22 DIAGNOSIS — I10 Essential (primary) hypertension: Secondary | ICD-10-CM | POA: Diagnosis not present

## 2023-10-22 DIAGNOSIS — Z800 Family history of malignant neoplasm of digestive organs: Secondary | ICD-10-CM | POA: Diagnosis not present

## 2023-10-22 NOTE — Patient Instructions (Signed)
 It was a pleasure to see you in clinic today. - CA125 today (blood marker) - MRI ordered - Referral to genetics - Return visit planned for after completion of MRI  Thank you very much for allowing me to provide care for you today.  I appreciate your confidence in choosing our Gynecologic Oncology team at [facility].  If you have any questions about your visit today please call our office or send us  a MyChart message and we will get back to you as soon as possible.

## 2023-10-22 NOTE — Progress Notes (Signed)
 GYNECOLOGIC ONCOLOGY NEW PATIENT CONSULTATION  Date of Brady: 10/22/2023 Referring Provider: Vicenta Poli, MD   ASSESSMENT AND PLAN: Pam Brady is a 73 y.o. woman with a 5 cm simple right ovarian cyst.  We reviewed that the exact etiology of the pelvic mass is unclear, but could include a benign, borderline, or malignant process.  Patient previously underwent multiple pelvic ultrasounds and a study.  She has had a known simple appearing cyst in her right ovary since at least 2003.  This was last imaged in 2014.  We reviewed that based on this longstanding history of a simple appearing cyst in the right ovary, this is most likely reassuring for a benign process.  In rare cases this could represent a low-grade malignant process.  However, given that she has had this cyst for potentially over 20 years without any progressive signs such as ascites, lymphadenopathy, or carcinomatosis, this is most likely a benign process despite its slow growth over time.  Given her family history of ovarian cancer mom, reasonable to perform additional testing for added reassurance.  Will get a CA125 today.  MRI pelvis to further characterize cyst.  If these are reassuring, discussed that we will likely not need to perform any interventions or additional follow-up.  Additionally, recommend referral to genetics given her family history of cancer with her mom with ovarian cancer in her sister with metastatic GI malignancy.  Referral placed.  Will patient return for virtual visit following completion of her MRI to review results and finalize plan.   A copy of this note was sent to the patient's referring provider.  Hoy Masters, MD Gynecologic Oncology   Medical Decision Making I personally spent  TOTAL 50 minutes face-to-face and non-face-to-face in the care of this patient, which includes all pre, intra, and post visit time on the date of Brady.   ------------  CC: Ovarian cyst  HISTORY  OF PRESENT ILLNESS:  Pam Brady is a 73 y.o. woman who is seen in consultation at the request of Claudene Pellet, MD for evaluation of ovarian cyst.  Patient was seen by Dr. Poli with [facility] surgery on 09/24/2023 for evaluation of the left sided abdominal mass versus hernia.  Patient reported 81-month history of feeling a mass on the left abdominal wall.  She noticed the bulge is progressively appeared more prominent.  She was recommended to undergo imaging and had a CT abdomen/pelvis performed on 10/03/2023 which did not note an abdominal wall mass.  She was incidentally found to have a 4.9 cm mildly complex cystic lesion in the right adnexa and a 4 cm intraluminal soft tissue density in the proximal stomach.  A pelvic ultrasound was subsequently performed on 10/08/2023 which noted a simple anechoic cyst in the right ovary measuring 5.2 x 2.9 x 3.4 cm.  She subsequently underwent upper endoscopy with Dr. Rosalie on 10/16/2023 with no mass identified in the stomach.  Today patient presents with her husband.  She notes a family history of ovarian cancer in her mother diagnosed in the 73s.  She reports that her mother underwent surgery followed by radiation treatments and then ultimately died of her disease following a recurrence.  She also reports that her sister was diagnosed with a metastatic cancer likely of GI origin that metastasized to the liver.  She also died of her disease.  Patient reports that there is no no family or personal genetic testing that she is aware of.  Previously, patient was involved in a study  screening for ovarian cancer and was previously noted to have a benign appearing cyst on her right ovary.  Her last ultrasound was performed in 2014 which noted a simple cyst measuring 2.7 x 2.1 x 2.1 cm.  She otherwise denies abdominal bloating, early satiety, significant weight loss, change in bowel or bladder habits.  Does report about a month of diarrhea in May 2024 after  which she started taking a probiotic and her diarrhea resolved.    PAST MEDICAL HISTORY: Past Medical History:  Diagnosis Date   Arthritis    Hypertension     PAST SURGICAL HISTORY: Past Surgical History:  Procedure Laterality Date   DILATION AND CURETTAGE OF UTERUS     in patient's 20's   DILATION AND CURETTAGE OF UTERUS     When patient was 1 - had to have a follow up D&C afterwards   orthognathic surgery     when patient was 33   TONSILECTOMY, ADENOIDECTOMY, BILATERAL MYRINGOTOMY AND TUBES  1957    OB/GYN HISTORY: OB History  Gravida Para Term Preterm AB Living  1    1   SAB IAB Ectopic Multiple Live Births  1        # Outcome Date GA Lbr Len/2nd Weight Sex Type Anes PTL Lv  1 SAB               Age at menarche: 20 Age at menopause: 33 Hx of HRT: HRT pills for about 2 years Hx of STI: no Last pap: unknown, normal per pt report History of abnormal pap smears: no  SCREENING STUDIES:  Last mammogram: 05/2023 Last colonoscopy: 2006 colonoscopy, fecal testing <1 year ago  MEDICATIONS:  Current Outpatient Medications:    atorvastatin (LIPITOR) 10 MG tablet, 1 tablet Orally Once a day; Duration: 90 days, Disp: , Rfl:    hydrochlorothiazide (MICROZIDE) 12.5 MG capsule, 1 capsule in the morning Orally Once a day; Duration: 90 days, Disp: , Rfl:    loratadine (CLARITIN) 10 MG tablet, 1 tablet Orally Once a day; Duration: 30 day(s), Disp: , Rfl:    losartan (COZAAR) 50 MG tablet, 25 mg., Disp: , Rfl:    meloxicam (MOBIC) 15 MG tablet, 1 tablet as needed Orally Once a day; Duration: 30 days, Disp: , Rfl:    saccharomyces boulardii (FLORASTOR) 250 MG capsule, 1 capsule., Disp: , Rfl:    valACYclovir (VALTREX) 1000 MG tablet, PRN, Disp: , Rfl:    verapamil (VERELAN) 240 MG 24 hr capsule, 1 capsule., Disp: , Rfl:   ALLERGIES: Allergies  Allergen Reactions   Codeine Nausea Only and Nausea And Vomiting    FAMILY HISTORY: Family History  Problem Relation Age of Onset    Ovarian cancer Mother 64   Cancer Sister 24       Metastatis to liver, unclear origin but suspected GI   Uterine cancer Maternal Aunt    Breast cancer Neg Hx    Pancreatic cancer Neg Hx    Prostate cancer Neg Hx    Colon cancer Neg Hx     SOCIAL HISTORY: Social History   Socioeconomic History   Marital status: Married    Spouse name: Not on file   Number of children: Not on file   Years of education: Not on file   Highest education level: Not on file  Occupational History   Not on file  Tobacco Use   Smoking status: Never   Smokeless tobacco: Never  Vaping Use   Vaping status: Never Used  Substance and Sexual Activity   Alcohol use: Yes    Comment: rarely   Drug use: Not Currently   Sexual activity: Yes  Other Topics Concern   Not on file  Social History Narrative   Not on file   Social Drivers of Health   Financial Resource Strain: Not on file  Food Insecurity: Not on file  Transportation Needs: Not on file  Physical Activity: Not on file  Stress: Not on file  Social Connections: Not on file  Intimate Partner Violence: Not on file    REVIEW OF SYSTEMS: New patient intake form was reviewed.  Complete 10-system review is negative except for the following: Bulge in upper left abdomen  PHYSICAL EXAM: BP 127/61 (BP Location: Left Arm, Patient Position: Sitting)   Pulse 70   Temp 98.4 F (36.9 C) (Oral)   Resp 18   Ht 5' 7 (1.702 m)   Wt 144 lb 9.6 oz (65.6 kg)   SpO2 100%   BMI 22.65 kg/m  Constitutional: No acute distress. Neuro/Psych: Alert, oriented.  Head and Neck: Normocephalic, atraumatic. Neck symmetric without masses. Sclera anicteric.  Respiratory: Normal work of breathing. Clear to auscultation bilaterally. Cardiovascular: Regular rate and rhythm, no murmurs, rubs, or gallops. Abdomen: Normoactive bowel sounds. Soft, non-distended, non-tender to palpation. No masses appreciated. Extremities:Grossly normal range of motion. Warm, well  perfused. No edema bilaterally. Skin: No rashes or lesions. Lymphatic: No cervical, supraclavicular, or inguinal adenopathy. Genitourinary: External genitalia without lesions. Urethral meatus without lesions or prolapse. On speculum exam, vagina and cervix without lesions. Bimanual exam reveals normal small cervix and uterus, mobile. Cystic structure in right adnexa, mobile. No nodularity.  Exam chaperoned by Kimberly [place], CMA   LABORATORY AND RADIOLOGIC DATA: Outside medical records were reviewed to synthesize the above history, along with the history and physical obtained during the visit.  Outside laboratory, pathology, and imaging reports were reviewed, with pertinent results below.  I personally reviewed the outside images.  Diagnosis  Date Value Ref Range Status  10/24/2016   Final   NEGATIVE FOR INTRAEPITHELIAL LESIONS OR MALIGNANCY.    US  PELVIC COMPLETE WITH TRANSVAGINAL 10/08/2023  Narrative CLINICAL DATA:  Follow-up right adnexal mass seen on CT.  EXAM: TRANSABDOMINAL AND TRANSVAGINAL ULTRASOUND OF PELVIS  TECHNIQUE: Both transabdominal and transvaginal ultrasound examinations of the pelvis were performed. Transabdominal technique was performed for global imaging of the pelvis including uterus, ovaries, adnexal regions, and pelvic cul-de-sac. It was necessary to proceed with endovaginal exam following the transabdominal exam to visualize the ovaries and endometrium.  COMPARISON:  CT abdomen and pelvis 10/03/2023.  FINDINGS: Uterus  Measurements: 3.2 x 1.6 x 3.1 cm = volume: 8.3 mL. No fibroids or other mass visualized.  Endometrium  Thickness: 2.2 mm.  No focal abnormality visualized.  Right ovary  In the right adnexa there is a simple anechoic cyst measuring 5.2 x 2.9 x 3.4 cm. No separate ovarian tissue identified.  Left ovary  Not visualized.  Other findings  No abnormal free fluid.  IMPRESSION: 1. Simple cyst in the right adnexa measuring up  to 5.2 cm. Consider GYN consult and followup US  in 3-6 months, or pelvis MRI w/o and w/ contrast for improved characterization. Note: This recommendation does not apply to premenarchal patients or to those with increased risk (genetic, family history, elevated tumor markers or other high-risk factors) of ovarian cancer. Reference: Radiology 2019 Nov; 293(2):359-371. Recommend follow-up ultrasound in 1 year. 2. Left ovary not visualized.   Electronically Signed By:  Greig Pique M.D. On: 10/08/2023 16:54   CT ABDOMEN PELVIS W CONTRAST 10/03/2023  Narrative CLINICAL DATA:  Abdominal wall mass.  Spigelian hernia.  EXAM: CT ABDOMEN AND PELVIS WITH CONTRAST  TECHNIQUE: Multidetector CT imaging of the abdomen and pelvis was performed using the standard protocol following bolus administration of intravenous contrast.  RADIATION DOSE REDUCTION: This exam was performed according to the departmental dose-optimization program which includes automated exposure control, adjustment of the mA and/or kV according to patient size and/or use of iterative reconstruction technique.  CONTRAST:  ISOVUE -300 IOPAMIDOL  (ISOVUE -300) INJECTION 61%  COMPARISON:  None Available.  FINDINGS: Lower Chest: No acute findings.  Hepatobiliary: No suspicious hepatic masses identified. Tiny calcified gallstone noted, without signs of cholecystitis or biliary ductal dilatation.  Pancreas:  No mass or inflammatory changes.  Spleen: Within normal limits in size and appearance.  Adrenals/Urinary Tract: Multiple small benign Bosniak category 1 and 2 renal cysts are seen, most in the left kidney (No followup imaging is recommended) . No suspicious masses identified. No evidence of ureteral calculi or hydronephrosis. Unremarkable unopacified urinary bladder.  Stomach/Bowel: An intraluminal soft tissue density is seen in the proximal body of the stomach which measures 3 x 4 cm, and is suspicious for  a polypoid gastric mass. No evidence of obstruction, inflammatory process or abnormal fluid collections. Normal appendix visualized.  Vascular/Lymphatic: No pathologically enlarged lymph nodes. No acute vascular findings.  Reproductive: Prior hysterectomy. A cystic lesion is seen in the right adnexal region which shows mild thickening and calcification along its lateral wall. This measures 4.9 x 3.6 cm, and is suspicious for cystic ovarian neoplasm. No other pelvic masses or free fluid identified.  Other: None. No No evidence of spigelian or other ventral abdominal wall hernia.  Musculoskeletal:  No suspicious bone lesions identified.  IMPRESSION: No evidence of spigelian or other ventral hernia. No evidence of abdominal wall mass.  4.9 cm mildly complex cystic lesion in right adnexal region, suspicious for cystic ovarian neoplasm. Recommend pelvic ultrasound or MRI for further characterization.  4 cm intraluminal soft tissue density in proximal body of stomach, suspicious for a polypoid gastric mass. Consider upper endoscopy or upper GI series for further evaluation.  Cholelithiasis. No radiographic evidence of cholecystitis.   Electronically Signed By: Norleen DELENA Kil M.D. On: 10/03/2023 14:48  Upper endoscopy (10/16/23): Impression: Small hiatal hernia Normal stomach Normal second portion of the duodenum, duodenal bulb, first portion of the duodenum, third portion of the duodenum, ampulla, major papilla and area of the papilla No specimens collected

## 2023-10-23 ENCOUNTER — Ambulatory Visit: Payer: Self-pay | Admitting: Psychiatry

## 2023-10-23 ENCOUNTER — Encounter: Admitting: Genetic Counselor

## 2023-10-23 LAB — CA 125: Cancer Antigen (CA) 125: 8.8 U/mL (ref 0.0–38.1)

## 2023-10-24 ENCOUNTER — Encounter: Payer: Self-pay | Admitting: Gastroenterology

## 2023-10-29 ENCOUNTER — Ambulatory Visit (HOSPITAL_COMMUNITY)
Admission: RE | Admit: 2023-10-29 | Discharge: 2023-10-29 | Disposition: A | Discharge status: Home / Self Care | Source: Ambulatory Visit | Attending: Psychiatry | Admitting: Psychiatry

## 2023-10-29 DIAGNOSIS — Z8041 Family history of malignant neoplasm of ovary: Secondary | ICD-10-CM | POA: Diagnosis not present

## 2023-10-29 DIAGNOSIS — N83201 Unspecified ovarian cyst, right side: Secondary | ICD-10-CM | POA: Insufficient documentation

## 2023-10-29 DIAGNOSIS — N83291 Other ovarian cyst, right side: Secondary | ICD-10-CM | POA: Diagnosis not present

## 2023-10-29 MED ORDER — GADOBUTROL 1 MMOL/ML IV SOLN
6.0000 mL | Freq: Once | INTRAVENOUS | Status: AC | PRN
  Administered 2023-10-29: 6 mL via INTRAVENOUS

## 2023-11-04 NOTE — Progress Notes (Unsigned)
 Gynecologic Oncology Telehealth Follow-up Note  I connected with Pam Brady on 11/05/23 at  4:30 PM EDT by telephone and verified that I am speaking with the correct person using two identifiers.  I discussed the limitations, risks, security and privacy concerns of performing an evaluation and management service by telemedicine and the availability of in-person appointments. I also discussed with the patient that there may be a patient responsible charge related to this service. The patient expressed understanding and agreed to proceed.  Other persons participating in the visit and their role in the encounter: none.  Patient's location: Home, [place] Provider's location: [facility]  Date of Service: 11/05/2023 Referring Provider: Vicenta Poli, MD   Assessment & Plan: Pam Brady is a 73 y.o. woman with 5cm simple right ovarian cyst who presents for follow-up.   CA125 was normal at 8.8.  Has genetics appt on 12/05/23 given her family history of cancer with her mom with ovarian cancer in her sister with metastatic GI malignancy.   Reviewed work-up to date and MRI results. Imaging benign, reassuring. Reviewed again that since she has had a known simple appearing cyst in her right ovary since at least 2003 and last imaged in 2014, this is also very reassuring.  Do not recommend surgical intervention at this time. Recommend repeat pelvic ultrasound in 6-44mo. If unchanged, no further monitoring indicated. We will plan to repeat the ultrasound in 69mo and follow-up after. Patient in agreement with plan.  RTC 69mo follow-up ultrasound.  Hoy Masters, MD Gynecologic Oncology   Medical Decision Making I personally spent  TOTAL 10 minutes via phone in the care of this patient.     ----------------------- Reason for Visit: follow-up  Treatment History: Patient was seen by Dr. Poli with [facility] surgery on 09/24/2023 for evaluation of the left sided abdominal mass  versus hernia.  Patient reported 23-month history of feeling a mass on the left abdominal wall.  She noticed the bulge is progressively appeared more prominent.  She was recommended to undergo imaging and had a CT abdomen/pelvis performed on 10/03/2023 which did not note an abdominal wall mass.  She was incidentally found to have a 4.9 cm mildly complex cystic lesion in the right adnexa and a 4 cm intraluminal soft tissue density in the proximal stomach.  A pelvic ultrasound was subsequently performed on 10/08/2023 which noted a simple anechoic cyst in the right ovary measuring 5.2 x 2.9 x 3.4 cm.  She subsequently underwent upper endoscopy with Dr. Rosalie on 10/16/2023 with no mass identified in the stomach.   She notes a family history of ovarian cancer in her mother diagnosed in the 72s.  She reports that her mother underwent surgery followed by radiation treatments and then ultimately died of her disease following a recurrence.     Previously, patient was involved in a study screening for ovarian cancer and was previously noted to have a benign appearing cyst on her right ovary.  Her last ultrasound was performed in 2014 which noted a simple cyst measuring 2.7 x 2.1 x 2.1 cm.  Interval History: No changes since her last visit. MRI completed.    Past Medical/Surgical History: Past Medical History:  Diagnosis Date   Arthritis    Hypertension     Past Surgical History:  Procedure Laterality Date   DILATION AND CURETTAGE OF UTERUS     in patient's 20's   DILATION AND CURETTAGE OF UTERUS     When patient was 1 - had to  have a follow up D&C afterwards   orthognathic surgery     when patient was 33   TONSILECTOMY, ADENOIDECTOMY, BILATERAL MYRINGOTOMY AND TUBES  1957    Family History  Problem Relation Age of Onset   Ovarian cancer Mother 96   Cancer Sister 76       Metastatis to liver, unclear origin but suspected GI   Uterine cancer Maternal Aunt    Breast cancer Neg Hx    Pancreatic  cancer Neg Hx    Prostate cancer Neg Hx    Colon cancer Neg Hx     Social History   Socioeconomic History   Marital status: Married    Spouse name: Not on file   Number of children: Not on file   Years of education: Not on file   Highest education level: Not on file  Occupational History   Not on file  Tobacco Use   Smoking status: Never   Smokeless tobacco: Never  Vaping Use   Vaping status: Never Used  Substance and Sexual Activity   Alcohol use: Yes    Comment: rarely   Drug use: Not Currently   Sexual activity: Yes  Other Topics Concern   Not on file  Social History Narrative   Not on file   Social Drivers of Health   Financial Resource Strain: Not on file  Food Insecurity: Not on file  Transportation Needs: Not on file  Physical Activity: Not on file  Stress: Not on file  Social Connections: Not on file    Current Medications:  Current Outpatient Medications:    atorvastatin (LIPITOR) 10 MG tablet, 1 tablet Orally Once a day; Duration: 90 days, Disp: , Rfl:    hydrochlorothiazide (MICROZIDE) 12.5 MG capsule, 1 capsule in the morning Orally Once a day; Duration: 90 days, Disp: , Rfl:    loratadine (CLARITIN) 10 MG tablet, 1 tablet Orally Once a day; Duration: 30 day(s), Disp: , Rfl:    losartan (COZAAR) 50 MG tablet, 25 mg., Disp: , Rfl:    meloxicam (MOBIC) 15 MG tablet, 1 tablet as needed Orally Once a day; Duration: 30 days, Disp: , Rfl:    saccharomyces boulardii (FLORASTOR) 250 MG capsule, 1 capsule., Disp: , Rfl:    valACYclovir (VALTREX) 1000 MG tablet, PRN, Disp: , Rfl:    verapamil (VERELAN) 240 MG 24 hr capsule, 1 capsule., Disp: , Rfl:   Review of Symptoms: Complete 10-system review is negative except as above in Interval History.  Physical Exam: Deferred given limitations of phone visit.   Laboratory & Radiologic Studies: Lab Results  Component Value Date   CAN125 8.8 10/22/2023     MR Pelvis W Wo Contrast  10/29/2023  Narrative CLINICAL DATA:  Right adnexal cyst  EXAM: MRI PELVIS WITHOUT AND WITH CONTRAST  TECHNIQUE: Multiplanar multisequence MR imaging of the pelvis was performed both before and after administration of intravenous contrast.  CONTRAST:  6mL GADAVIST  GADOBUTROL  1 MMOL/ML IV SOLN  COMPARISON:  CT abdomen pelvis, 10/03/2023  FINDINGS: Urinary Tract:  No abnormality visualized.  Bowel:  Unremarkable visualized pelvic bowel loops.  Vascular/Lymphatic: No pathologically enlarged lymph nodes. No significant vascular abnormality seen.  Reproductive: Normal postmenopausal uterus. Right adnexal or ovarian cyst measuring 4.5 x 3.2 cm, unchanged. Normal postmenopausal appearance of the left ovary.  Other:  None.  Musculoskeletal: No suspicious bone lesions identified.  IMPRESSION: 1. Right adnexal or ovarian cyst measuring 4.5 x 3.2 cm, unchanged. Recommend follow-up ultrasound in 6-12 months to establish  long-term stability in the postmenopausal setting. 2. No solid mass or suspicious contrast enhancement in the pelvis.   Electronically Signed By: Marolyn JONETTA Jaksch M.D. On: 11/01/2023 06:41

## 2023-11-05 ENCOUNTER — Inpatient Hospital Stay: Admitting: Psychiatry

## 2023-11-05 ENCOUNTER — Encounter: Payer: Self-pay | Admitting: Psychiatry

## 2023-11-05 DIAGNOSIS — N83201 Unspecified ovarian cyst, right side: Secondary | ICD-10-CM | POA: Diagnosis not present

## 2023-11-06 ENCOUNTER — Telehealth: Payer: Self-pay

## 2023-11-06 NOTE — Telephone Encounter (Signed)
 Per Dr.Newton a pelvic ultrasound has been scheduled in 6 months at [facility] on 05/07/24.   Pt is aware.

## 2023-11-06 NOTE — Telephone Encounter (Signed)
-----   Message from [place], [place] sent at 11/05/2023  6:26 PM EDT ----- Regarding: follow-up Please schedule pt for pelvic utlrasound in 48mo and follow-up with me after.  Thanks, [employer]

## 2023-12-05 ENCOUNTER — Inpatient Hospital Stay

## 2023-12-05 ENCOUNTER — Inpatient Hospital Stay: Admitting: Genetic Counselor

## 2023-12-06 ENCOUNTER — Telehealth: Payer: Self-pay | Admitting: *Deleted

## 2023-12-06 NOTE — Telephone Encounter (Signed)
 Spoke with the patient regarding her genetics appt. Patient canceled the appt and will reschedule if needed after her US  in six months.

## 2024-03-27 ENCOUNTER — Other Ambulatory Visit (HOSPITAL_BASED_OUTPATIENT_CLINIC_OR_DEPARTMENT_OTHER): Payer: Self-pay | Admitting: Family Medicine

## 2024-03-27 DIAGNOSIS — Z13820 Encounter for screening for osteoporosis: Secondary | ICD-10-CM

## 2024-05-07 ENCOUNTER — Other Ambulatory Visit (HOSPITAL_COMMUNITY)
